# Patient Record
Sex: Male | Born: 1980 | Race: White | Hispanic: No | Marital: Single | Smoking: Never smoker
Health system: Southern US, Community
[De-identification: ages and names within clinical notes are randomized; demographics above are authoritative.]

---
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
## 2006-11-17 ENCOUNTER — Ambulatory Visit: Payer: Self-pay | Admitting: Family Medicine

## 2006-11-19 ENCOUNTER — Ambulatory Visit: Payer: Self-pay | Admitting: Family Medicine

## 2007-12-19 ENCOUNTER — Ambulatory Visit: Payer: Self-pay | Admitting: Family Medicine

## 2008-04-09 ENCOUNTER — Ambulatory Visit: Payer: Self-pay | Admitting: Family Medicine

## 2008-04-12 ENCOUNTER — Ambulatory Visit: Payer: Self-pay | Admitting: Family Medicine

## 2008-05-23 ENCOUNTER — Ambulatory Visit: Payer: Self-pay | Admitting: Family Medicine

## 2008-05-23 ENCOUNTER — Encounter: Admission: RE | Admit: 2008-05-23 | Discharge: 2008-05-23 | Payer: Self-pay | Admitting: Family Medicine

## 2010-02-25 ENCOUNTER — Ambulatory Visit: Payer: Self-pay | Admitting: Family Medicine

## 2010-04-30 ENCOUNTER — Ambulatory Visit: Payer: Self-pay | Admitting: Family Medicine

## 2010-10-20 ENCOUNTER — Encounter (HOSPITAL_COMMUNITY): Payer: Self-pay

## 2010-10-20 ENCOUNTER — Emergency Department (HOSPITAL_COMMUNITY): Payer: BC Managed Care – PPO

## 2010-10-20 ENCOUNTER — Emergency Department (HOSPITAL_COMMUNITY)
Admission: EM | Admit: 2010-10-20 | Discharge: 2010-10-20 | Disposition: A | Discharge status: Home / Self Care | Payer: BC Managed Care – PPO | Attending: Emergency Medicine | Admitting: Emergency Medicine

## 2010-10-20 DIAGNOSIS — Y9289 Other specified places as the place of occurrence of the external cause: Secondary | ICD-10-CM | POA: Insufficient documentation

## 2010-10-20 DIAGNOSIS — S9030XA Contusion of unspecified foot, initial encounter: Secondary | ICD-10-CM | POA: Insufficient documentation

## 2010-10-20 DIAGNOSIS — X500XXA Overexertion from strenuous movement or load, initial encounter: Secondary | ICD-10-CM | POA: Insufficient documentation

## 2011-08-28 ENCOUNTER — Telehealth: Payer: Self-pay | Admitting: Family Medicine

## 2011-08-28 NOTE — Telephone Encounter (Signed)
DONE

## 2015-05-30 ENCOUNTER — Encounter: Payer: Self-pay | Admitting: Podiatry

## 2015-05-30 ENCOUNTER — Ambulatory Visit (INDEPENDENT_AMBULATORY_CARE_PROVIDER_SITE_OTHER): Payer: BLUE CROSS/BLUE SHIELD | Admitting: Podiatry

## 2015-05-30 ENCOUNTER — Ambulatory Visit (INDEPENDENT_AMBULATORY_CARE_PROVIDER_SITE_OTHER): Payer: BLUE CROSS/BLUE SHIELD

## 2015-05-30 VITALS — BP 128/77 | HR 69 | Resp 16

## 2015-05-30 DIAGNOSIS — M779 Enthesopathy, unspecified: Secondary | ICD-10-CM

## 2015-05-30 DIAGNOSIS — M79671 Pain in right foot: Secondary | ICD-10-CM | POA: Diagnosis not present

## 2015-05-30 MED ORDER — MELOXICAM 15 MG PO TABS: 15.0000 mg | ORAL_TABLET | Freq: Every day | ORAL | Status: DC

## 2015-05-30 NOTE — Progress Notes (Signed)
   Subjective:    Patient ID: William Benton, male    DOB: [birth year], 34 y.o.   MRN: [id number]  HPI Comments: "I am having some pain in the bottom of this foot"  Patient presents with: Foot Pain: Plantar forefoot - 2nd/3rd MPJ right - aching and tingling for 4 days, swollen, also feeling a pulling sensation in lateral shin area right leg    Foot Pain Associated symptoms include arthralgias and myalgias.      Review of Systems  Musculoskeletal: Positive for myalgias, back pain and arthralgias.  All other systems reviewed and are negative.      Objective:   Physical Exam: 34 year old white male in no apparent distress presents today with a chief complaint of painful second metatarsal area with an aching and pulling sensation 4 days. He also complains of painful calluses bilateral heels. Vital signs are stable he is alert and oriented 3. No erythema edema cellulitis drainage or odor. Pulses are strongly palpable bilateral. Neurologic sensorium is intact per Semmes-Weinstein monofilament. Neurologic sensorium also demonstrates deep tendon reflexes are intact. Muscle strength +5 over 5 dorsiflexion and plantar flexors and inverters everters all intrinsic musculature is intact. Orthopedic evaluation of his strength all joints distal to the ankle had full range of motion without crepitation. Cutaneous evaluation of straight supple well-hydrated cutis no erythema edema cellulitis drainage or odor. He has pain on end range of motion of the second metatarsophalangeal joint of the right foot. Pain on direct palpation of that joint. Radiographic evaluation does demonstrate elongated plantarflexed second metatarsal of the right foot resulting in capsulitis.        Assessment & Plan:  Assessment: Capsulitis second metatarsophalangeal joint right foot with dry xerotic skin.  Plan: Injected around the joint today with Kenalog and local anesthesia placed him on a meloxicam. I also suggested an  emollient for his skin.  Thigh-high DPM

## 2020-04-15 ENCOUNTER — Ambulatory Visit (INDEPENDENT_AMBULATORY_CARE_PROVIDER_SITE_OTHER): Payer: BC Managed Care – PPO | Admitting: Podiatrist

## 2020-04-15 ENCOUNTER — Other Ambulatory Visit: Payer: Self-pay

## 2020-04-15 DIAGNOSIS — M216X9 Other acquired deformities of unspecified foot: Secondary | ICD-10-CM | POA: Diagnosis not present

## 2020-04-15 DIAGNOSIS — S90821A Blister (nonthermal), right foot, initial encounter: Secondary | ICD-10-CM | POA: Diagnosis not present

## 2020-04-15 DIAGNOSIS — S90822A Blister (nonthermal), left foot, initial encounter: Secondary | ICD-10-CM

## 2020-04-15 DIAGNOSIS — Q667 Congenital pes cavus, unspecified foot: Secondary | ICD-10-CM | POA: Diagnosis not present

## 2020-04-15 NOTE — Progress Notes (Signed)
   Chief Complaint  Patient presents with  . Skin Problem    Bilateral heels and plantar forefoot. x2+ weeks. Pt stated, "I do a lot of walking for [employer]. I noticed peeling skin w/o drainage. I thought they were blisters. I started having pain that was up to 8/10". No treatment.      HPI: Patient is 39 y.o. male who presents today for blisters on the bottoms of both feet for over 2 weeks.  He relates he works for [employer] and does a lot of walking.  He has to wear company issued brown socks and brown shoes.  He currently wears sketchers shoes.  There are no problems to display for this patient.   No current outpatient medications on file prior to visit.   No current facility-administered medications on file prior to visit.    No Known Allergies  Review of Systems No fevers, chills, nausea, muscle aches, no difficulty breathing, no calf pain, no chest pain or shortness of breath.   Physical Exam  GENERAL APPEARANCE: Alert, conversant. Appropriately groomed. No acute distress.   VASCULAR: Pedal pulses palpable DP and PT bilateral.  Capillary refill time is immediate to all digits,  Proximal to distal cooling it warm to warm.  Digital perfusion adequate.   NEUROLOGIC: sensation is intact to 5.07 monofilament at 5/5 sites bilateral.  Light touch is intact bilateral, vibratory sensation intact bilateral  MUSCULOSKELETAL: acceptable muscle strength, tone and stability bilateral.  High arch foot type is noted with pressure points of metatarsal heads and heels noted.   No pain, crepitus or limitation noted with foot and ankle range of motion bilateral.   DERMATOLOGIC: areas of dried blisters have formed on the plantar aspect of bilateral feet at the metatarsal head region and heels.  The blisters appear non infected but are painful when walking.  No sign of superficial skin fungal infection noted-- as these blisters appear to be from friction and shear from walking.      Assessment      ICD-10-CM   1. 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 Prominent metatarsal head, unspecified laterality  M21.6X9   3. Blister of plantar aspect of left foot, initial encounter  S90.822A   4. Blister of plantar aspect of right foot, initial encounter  [id number]      Plan  Discussed shoe gear changes including getting new balance shoes that will work with the companys dress code.  In general discussed new balance makes a better walking shoe and this should be helpful for him.  Discussed the positive benefits of orthotics- will check coverage and let him know  Discussed use of pumice stone at home to slowly work away the dead skin.

## 2020-04-18 ENCOUNTER — Encounter: Payer: Self-pay | Admitting: Podiatrist

## 2020-09-14 ENCOUNTER — Emergency Department (HOSPITAL_COMMUNITY): Payer: No Typology Code available for payment source

## 2020-09-14 ENCOUNTER — Emergency Department (HOSPITAL_COMMUNITY)
Admission: EM | Admit: 2020-09-14 | Discharge: 2020-09-14 | Disposition: A | Discharge status: Home / Self Care | Payer: No Typology Code available for payment source | Attending: Emergency Medicine | Admitting: Emergency Medicine

## 2020-09-14 ENCOUNTER — Other Ambulatory Visit: Payer: Self-pay

## 2020-09-14 ENCOUNTER — Encounter (HOSPITAL_COMMUNITY): Payer: Self-pay | Admitting: *Deleted

## 2020-09-14 DIAGNOSIS — W000XXA Fall on same level due to ice and snow, initial encounter: Secondary | ICD-10-CM | POA: Diagnosis not present

## 2020-09-14 DIAGNOSIS — S52021A Displaced fracture of olecranon process without intraarticular extension of right ulna, initial encounter for closed fracture: Secondary | ICD-10-CM

## 2020-09-14 DIAGNOSIS — S52024A Nondisplaced fracture of olecranon process without intraarticular extension of right ulna, initial encounter for closed fracture: Secondary | ICD-10-CM | POA: Diagnosis not present

## 2020-09-14 DIAGNOSIS — S59901A Unspecified injury of right elbow, initial encounter: Secondary | ICD-10-CM | POA: Diagnosis present

## 2020-09-14 MED ORDER — MORPHINE SULFATE (PF) 4 MG/ML IV SOLN
4.0000 mg | Freq: Once | INTRAVENOUS | Status: AC
Start: 2020-09-14 — End: 2020-09-14
  Administered 2020-09-14: 4 mg via INTRAVENOUS
  Filled 2020-09-14: qty 1

## 2020-09-14 MED ORDER — HYDROCODONE-ACETAMINOPHEN 5-325 MG PO TABS: 1.0000 | ORAL_TABLET | ORAL | 0 refills | Status: AC | PRN

## 2020-09-14 MED ORDER — ONDANSETRON HCL 4 MG/2ML IJ SOLN
4.0000 mg | Freq: Once | INTRAMUSCULAR | Status: AC
  Administered 2020-09-14: 4 mg via INTRAVENOUS
  Filled 2020-09-14: qty 2

## 2020-09-14 MED ORDER — HYDROCODONE-ACETAMINOPHEN 5-325 MG PO TABS
1.0000 | ORAL_TABLET | Freq: Once | ORAL | Status: AC
Start: 2020-09-14 — End: 2020-09-14
  Administered 2020-09-14: 1 via ORAL
  Filled 2020-09-14: qty 1

## 2020-09-14 NOTE — ED Provider Notes (Signed)
[facility] EMERGENCY DEPARTMENT Provider Note   CSN: [id number] Arrival date & time: 09/14/20  0022     History Chief Complaint  Patient presents with  . Fall    William Benton is a 40 y.o. male.  Patient presents with complaint of severe right elbow pain after a fall.  He reports that he slipped on the snow and fell directly onto his right elbow.  Patient reports increased pain with moving the elbow.  No other injury.  Did not hit his head or lose consciousness.        History reviewed. No pertinent past medical history.  There are no problems to display for this patient.   History reviewed. No pertinent surgical history.     No family history on file.  Social History   Tobacco Use  . Smoking status: Never Smoker  Substance Use Topics  . Alcohol use: No    Alcohol/week: 0.0 standard drinks    Home Medications Prior to Admission medications   Not on File    Allergies    Patient has no known allergies.  Review of Systems   Review of Systems  Musculoskeletal: Positive for arthralgias.  All other systems reviewed and are negative.   Physical Exam Updated Vital Signs BP 119/73 (BP Location: Left Arm)   Pulse 68   Temp 98.2 F (36.8 C) (Oral)   Resp 18   Ht 5\' 10"  (1.778 m)   Wt 81.6 kg   SpO2 100%   BMI 25.83 kg/m   Physical Exam Vitals and nursing note reviewed.  Constitutional:      General: He is not in acute distress.    Appearance: Normal appearance. He is well-developed and well-nourished.  HENT:     Head: Normocephalic and atraumatic.     Right Ear: Hearing normal.     Left Ear: Hearing normal.     Nose: Nose normal.     Mouth/Throat:     Mouth: Oropharynx is clear and moist and mucous membranes are normal.  Eyes:     Extraocular Movements: EOM normal.     Conjunctiva/sclera: Conjunctivae normal.     Pupils: Pupils are equal, round, and reactive to light.  Cardiovascular:     Rate and Rhythm: Regular rhythm.      Heart sounds: S1 normal and S2 normal. No murmur heard. No friction rub. No gallop.   Pulmonary:     Effort: Pulmonary effort is normal. No respiratory distress.     Breath sounds: Normal breath sounds.  Chest:     Chest wall: No tenderness.  Abdominal:     General: Bowel sounds are normal.     Palpations: Abdomen is soft. There is no hepatosplenomegaly.     Tenderness: There is no abdominal tenderness. There is no guarding or rebound. Negative signs include Murphy's sign and McBurney's sign.     Hernia: No hernia is present.  Musculoskeletal:     Right elbow: Swelling present. No deformity. Decreased range of motion. Tenderness present.     Cervical back: Normal range of motion and neck supple.  Skin:    General: Skin is warm, dry and intact.     Findings: No rash.     Nails: There is no cyanosis.  Neurological:     Mental Status: He is alert and oriented to person, place, and time.     GCS: GCS eye subscore is 4. GCS verbal subscore is 5. GCS motor subscore is 6.  Cranial Nerves: No cranial nerve deficit.     Sensory: No sensory deficit.     Coordination: Coordination normal.     Deep Tendon Reflexes: Strength normal.  Psychiatric:        Mood and Affect: Mood and affect normal.        Speech: Speech normal.        Behavior: Behavior normal.        Thought Content: Thought content normal.     ED Results / Procedures / Treatments   Labs (all labs ordered are listed, but only abnormal results are displayed) Labs Reviewed - No data to display  EKG None  Radiology No results found.  Procedures Procedures (including critical care time)  Medications Ordered in ED Medications  morphine 4 MG/ML injection 4 mg (has no administration in time range)  ondansetron (ZOFRAN) injection 4 mg (has no administration in time range)    ED Course  I have reviewed the triage vital signs and the nursing notes.  Pertinent labs & imaging results that were available during my  care of the patient were reviewed by me and considered in my medical decision making (see chart for details).    MDM Rules/Calculators/A&P                          Patient presents with isolated right elbow injury after a fall.  X-ray shows nondisplaced olecranon process fracture.  Patient provided analgesia, long-arm splint, sling and follow-up with orthopedics. Final Clinical Impression(s) / ED Diagnoses Final diagnoses:  None    Rx / DC Orders ED Discharge Orders    None       Rodd Heft, Canary Brim, MD 09/15/20 [phone]

## 2020-09-14 NOTE — ED Notes (Signed)
Pt given additional pain meds prior to d/c. Patient educated about not driving or performing other critical tasks (such as operating heavy machinery, caring for infant/toddler/child) due to sedative nature of narcotic medications received while in the ED.  Pt/caregiver verbalized understanding.  Pt verbalized understanding of d/c instructions, f/u with ortho and pain medication rx given.

## 2020-09-14 NOTE — Progress Notes (Signed)
Orthopedic Tech Progress Note Patient Details:  William Benton 05-Jan-1981 [id number]  Ortho Devices Type of Ortho Device: Arm sling,Long arm splint Ortho Device/Splint Location: Right Arm Ortho Device/Splint Interventions: Application   Post Interventions Patient Tolerated: Well Instructions Provided: Care of device,Adjustment of device   Ladean Steinmeyer E Alisha Bacus 09/14/2020, 1:56 AM

## 2020-09-14 NOTE — ED Notes (Signed)
Ortho tech at the bedside.  

## 2020-09-14 NOTE — ED Triage Notes (Signed)
Pt says that he slipped on the snow walking to his car, he c/o pain in the right elbow area, no other c/o pain. Pain still 9/10

## 2020-09-14 NOTE — ED Triage Notes (Signed)
Pt arrives via [facility], leaving work, slipped on ice fell on the right side, he c/o pain in the right elbow, no obvious deformity noted.  10/10 pain in extremity. . IV access in the left ac. No LOC. Fentanyl x 2 doses. 117/60, hr 80's.

## 2022-04-17 IMAGING — CR DG ELBOW COMPLETE 3+V*R*
4 series · 4 of 4 positions shown · non-contrast
Comparison: None.

CLINICAL DATA: Fell on ice.  Right elbow deformity.

EXAM:
RIGHT ELBOW - COMPLETE 3+ VIEW

[elbow lat]
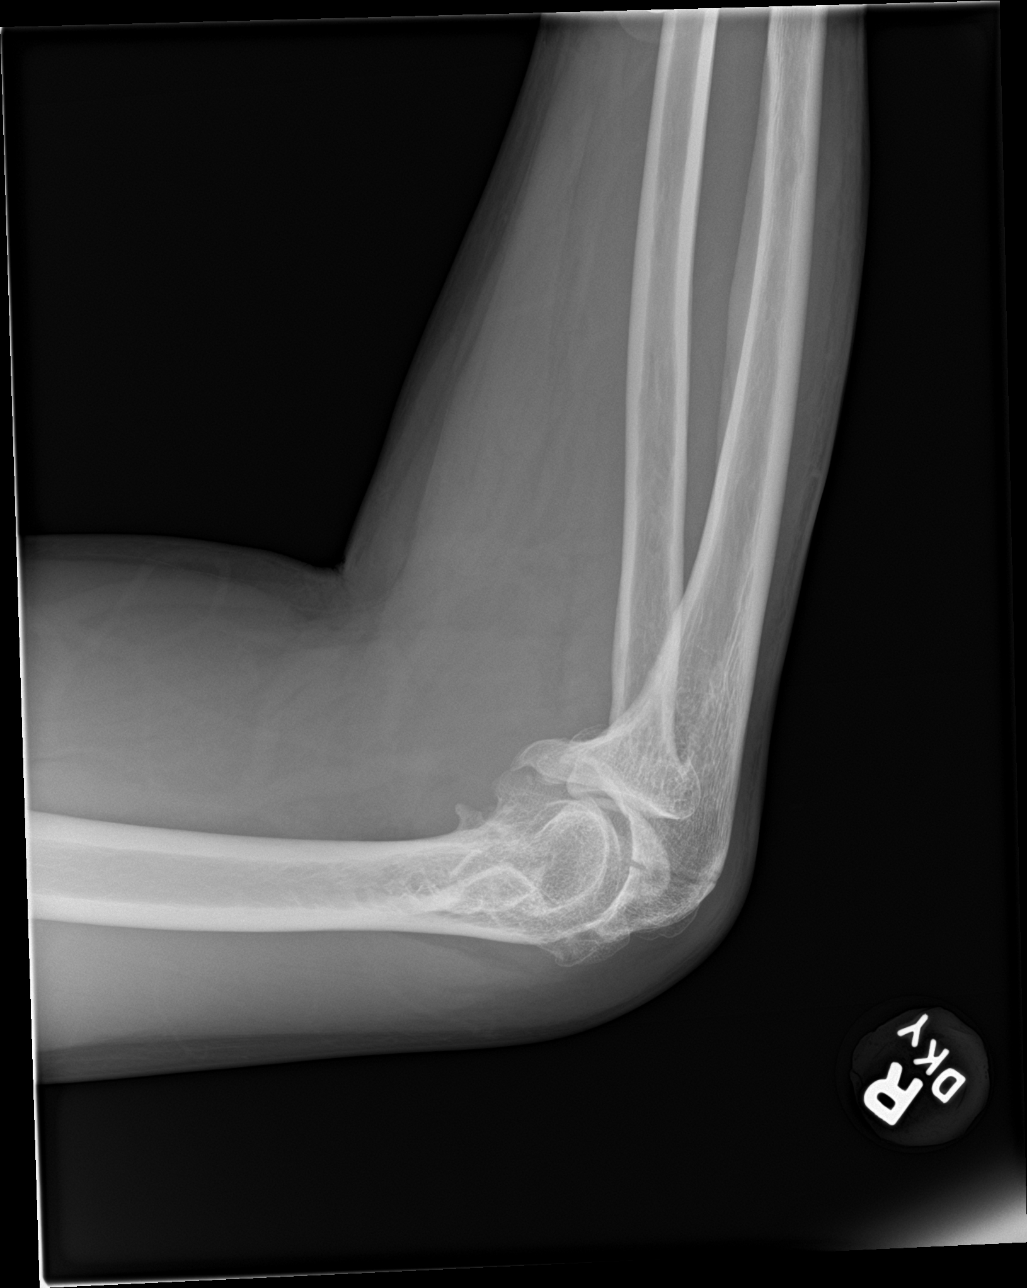

[elbow obl (1 of 2)]
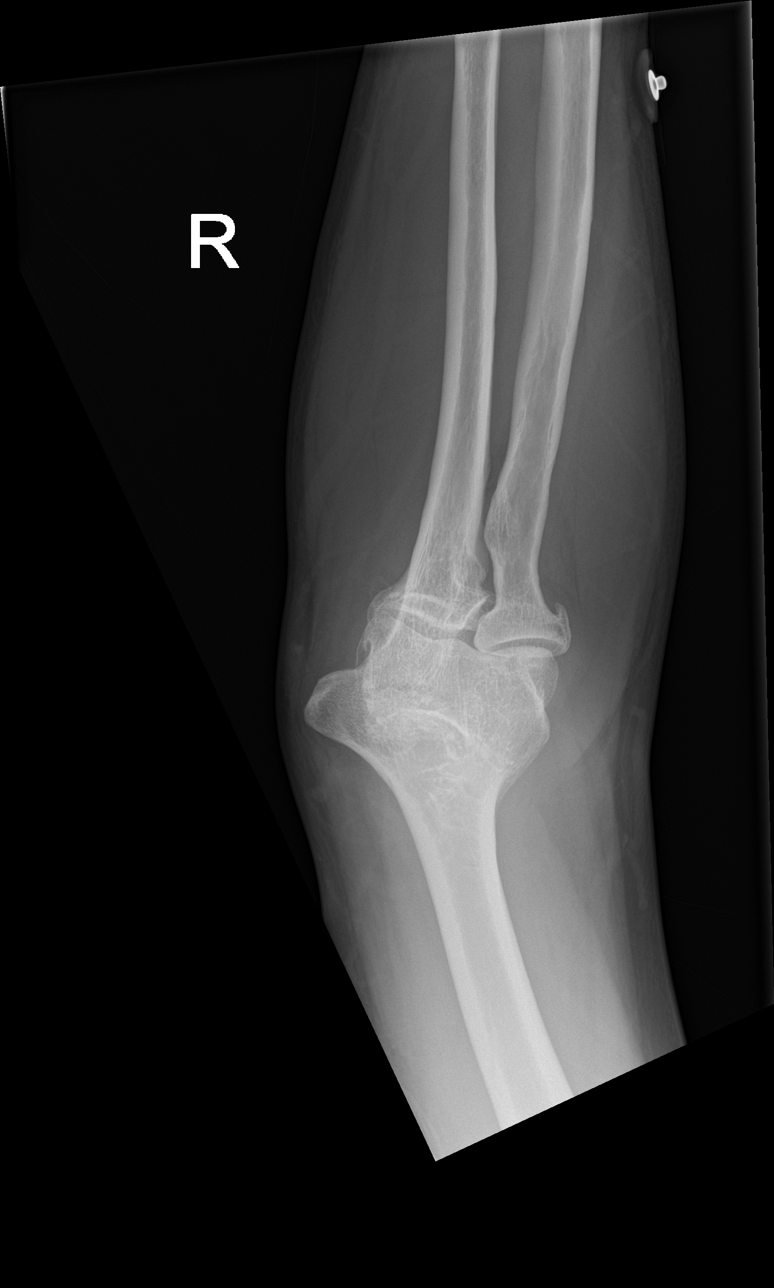

[elbow obl (2 of 2)]
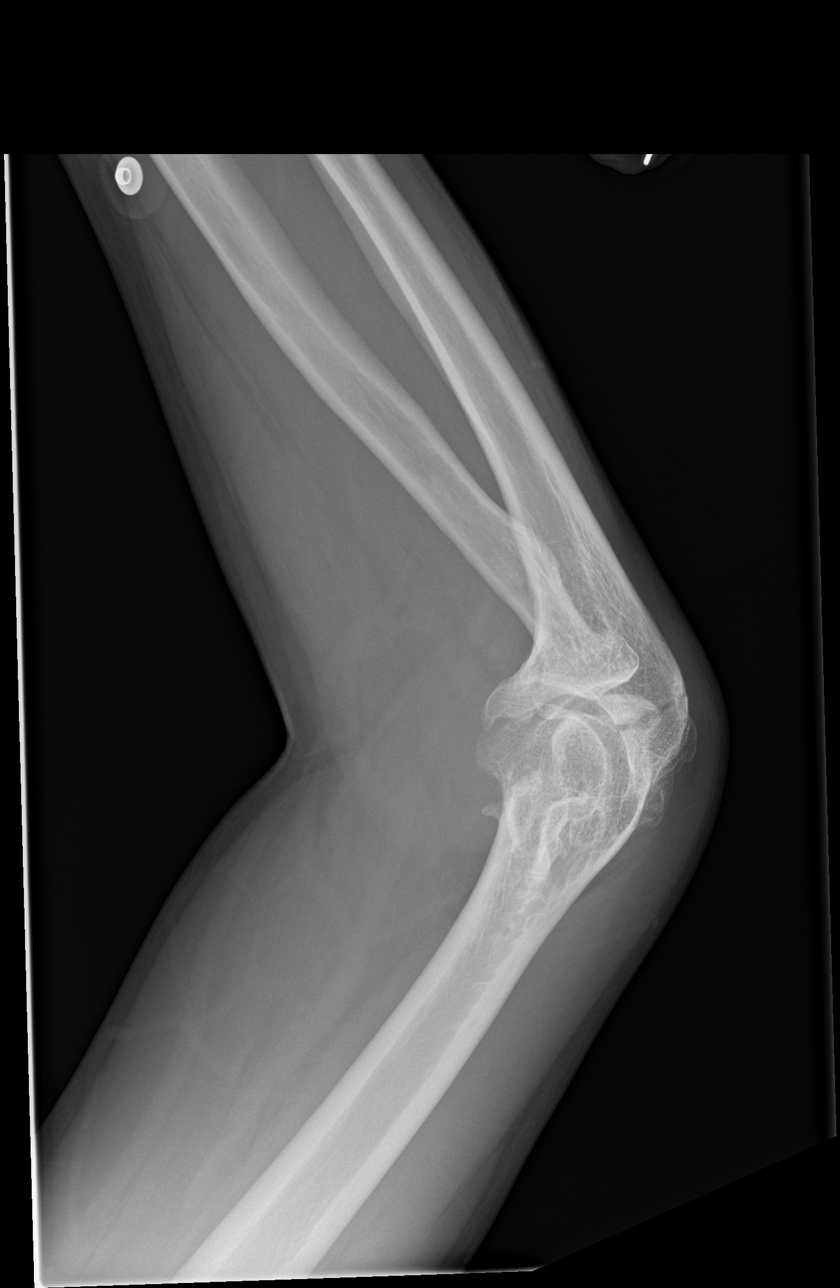

[elbow ap]
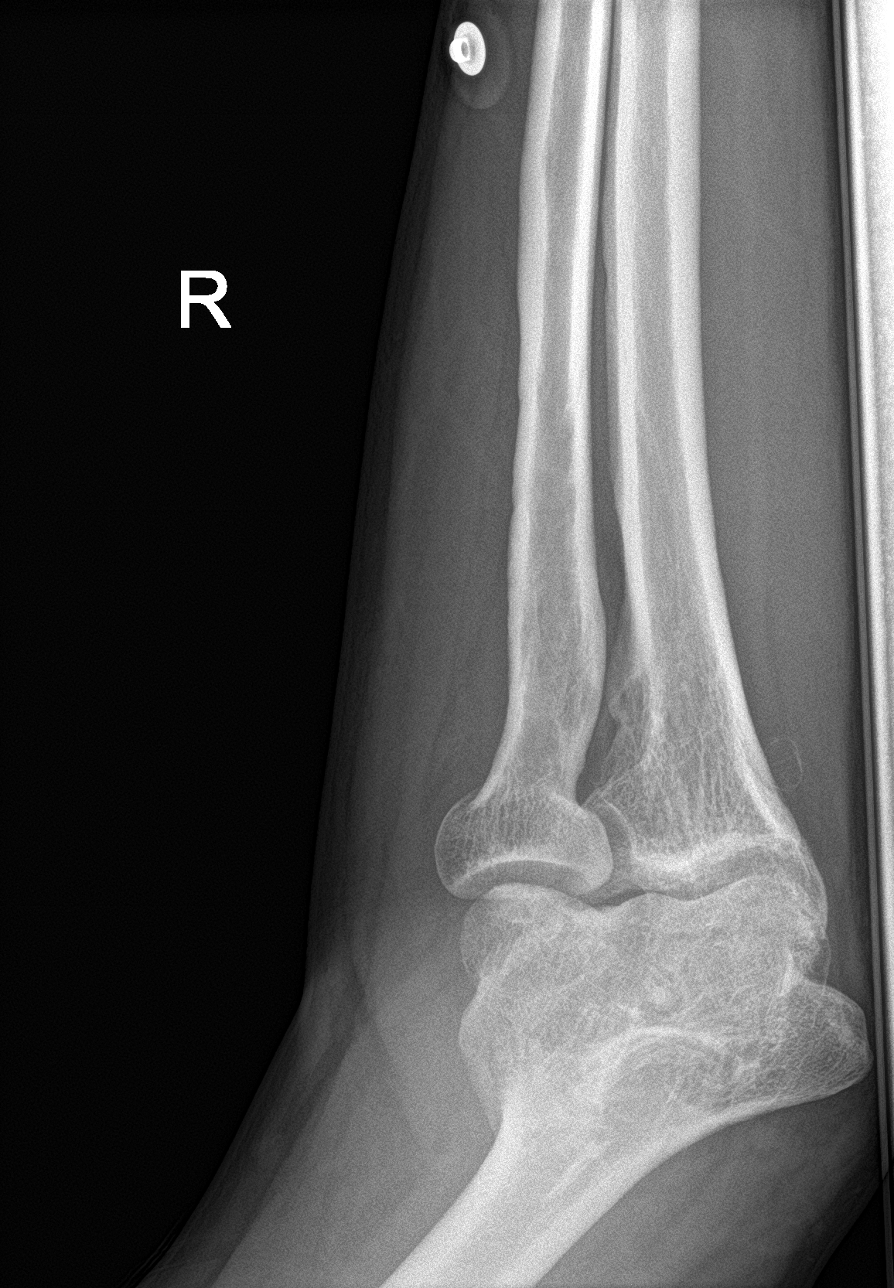

[4 of 4 positions shown; findings below may reference images not displayed]

FINDINGS: Degenerative changes in the right elbow. Prominent osteophyte
formation. Linear nondisplaced fracture of the posterior olecranon
extending to the articular surface. Associated elbow effusion. No
additional fractures are identified. No dislocation.
IMPRESSION: Nondisplaced fracture of the posterior olecranon with associated
effusion.

## 2022-11-29 ENCOUNTER — Emergency Department (HOSPITAL_COMMUNITY)
Admission: EM | Admit: 2022-11-29 | Discharge: 2022-11-29 | Disposition: A | Discharge status: Home / Self Care | Payer: BC Managed Care – PPO | Attending: Emergency Medicine | Admitting: Emergency Medicine

## 2022-11-29 ENCOUNTER — Other Ambulatory Visit: Payer: Self-pay

## 2022-11-29 ENCOUNTER — Emergency Department (HOSPITAL_COMMUNITY): Payer: BC Managed Care – PPO

## 2022-11-29 DIAGNOSIS — R109 Unspecified abdominal pain: Secondary | ICD-10-CM | POA: Diagnosis present

## 2022-11-29 DIAGNOSIS — I880 Nonspecific mesenteric lymphadenitis: Secondary | ICD-10-CM | POA: Insufficient documentation

## 2022-11-29 LAB — BASIC METABOLIC PANEL
Anion gap: 6 (ref 5–15)
BUN: 14 mg/dL (ref 6–20)
CO2: 22 mmol/L (ref 22–32)
Calcium: 8.9 mg/dL (ref 8.9–10.3)
Chloride: 111 mmol/L (ref 98–111)
Creatinine, Ser: 0.76 mg/dL (ref 0.61–1.24)
GFR, Estimated: >60 mL/min (ref >60)
Glucose, Bld: 96 mg/dL (ref 70–99)
Potassium: 3.7 mmol/L (ref 3.5–5.1)
Sodium: 139 mmol/L (ref 135–145)

## 2022-11-29 LAB — CBC WITH DIFFERENTIAL/PLATELET
Abs Immature Granulocytes: 0.04 10*3/uL (ref 0.00–0.07)
Basophils Absolute: 0.1 10*3/uL (ref 0.0–0.1)
Basophils Relative: 1 %
Eosinophils Absolute: 0.2 10*3/uL (ref 0.0–0.5)
Eosinophils Relative: 2 %
HCT: 48.6 % (ref 39.0–52.0)
Hemoglobin: 16.6 g/dL (ref 13.0–17.0)
Immature Granulocytes: 0 %
Lymphocytes Relative: 26 %
Lymphs Abs: 2.5 10*3/uL (ref 0.7–4.0)
MCH: 30.9 pg (ref 26.0–34.0)
MCHC: 34.2 g/dL (ref 30.0–36.0)
MCV: 90.3 fL (ref 80.0–100.0)
Monocytes Absolute: 0.7 10*3/uL (ref 0.1–1.0)
Monocytes Relative: 7 %
Neutro Abs: 6.1 10*3/uL (ref 1.7–7.7)
Neutrophils Relative %: 64 %
Platelets: 295 10*3/uL (ref 150–400)
RBC: 5.38 MIL/uL (ref 4.22–5.81)
RDW: 12.2 % (ref 11.5–15.5)
WBC: 9.6 10*3/uL (ref 4.0–10.5)
nRBC: 0 % (ref 0.0–0.2)

## 2022-11-29 LAB — URINALYSIS, ROUTINE W REFLEX MICROSCOPIC
Bilirubin Urine: NEGATIVE
Glucose, UA: NEGATIVE mg/dL
Hgb urine dipstick: NEGATIVE
Ketones, ur: NEGATIVE mg/dL
Leukocytes,Ua: NEGATIVE
Nitrite: NEGATIVE
Protein, ur: NEGATIVE mg/dL
Specific Gravity, Urine: 1.013 (ref 1.005–1.030)
pH: 8 (ref 5.0–8.0)

## 2022-11-29 MED ORDER — ONDANSETRON HCL 4 MG/2ML IJ SOLN
4.0000 mg | Freq: Once | INTRAMUSCULAR | Status: AC
  Administered 2022-11-29: 4 mg via INTRAVENOUS
  Filled 2022-11-29: qty 2

## 2022-11-29 MED ORDER — HYDROMORPHONE HCL 1 MG/ML IJ SOLN
0.5000 mg | Freq: Once | INTRAMUSCULAR | Status: AC
  Administered 2022-11-29: 0.5 mg via INTRAVENOUS
  Filled 2022-11-29: qty 1

## 2022-11-29 MED ORDER — OXYCODONE-ACETAMINOPHEN 5-325 MG PO TABS: 1.0000 | ORAL_TABLET | Freq: Three times a day (TID) | ORAL | 0 refills | Status: AC | PRN

## 2022-11-29 MED ORDER — METHOCARBAMOL 500 MG PO TABS: 500.0000 mg | ORAL_TABLET | Freq: Three times a day (TID) | ORAL | 0 refills | Status: AC | PRN

## 2022-11-29 MED ORDER — SODIUM CHLORIDE 0.9 % IV SOLN: 100.0000 mg | Freq: Once | INTRAVENOUS | Status: DC

## 2022-11-29 MED ORDER — KETOROLAC TROMETHAMINE 15 MG/ML IJ SOLN
15.0000 mg | Freq: Once | INTRAMUSCULAR | Status: AC
  Administered 2022-11-29: 15 mg via INTRAVENOUS
  Filled 2022-11-29: qty 1

## 2022-11-29 NOTE — ED Triage Notes (Signed)
Pt arrived via POV. Pt c/o L flank pain beginning this AM. Visibly uncomfortable.  AOx4

## 2022-11-29 NOTE — ED Provider Notes (Signed)
EMERGENCY DEPARTMENT AT [facility] Provider Note   CSN: [id number] Arrival date & time: 11/29/22  1220     History  Chief Complaint  Patient presents with   Flank Pain    William Benton is a 42 y.o. male.   Flank Pain  Patient presents with left flank pain.  Woke with it this morning.  Uncomfortable.  Feels the need to urinate.  Cannot find a comfortable position.  No fevers.  Has had some nausea.  No diarrhea or constipation.  Has not had pains like this before.  No history of kidney stones.    No past medical history on file.  Home Medications Prior to Admission medications   Medication Sig Start Date End Date Taking? Authorizing Provider  methocarbamol (ROBAXIN) 500 MG tablet Take 1 tablet (500 mg total) by mouth every 8 (eight) hours as needed for muscle spasms. 11/29/22  Yes Benjiman Core, MD  oxyCODONE-acetaminophen (PERCOCET/ROXICET) 5-325 MG tablet Take 1-2 tablets by mouth every 8 (eight) hours as needed for severe pain. 11/29/22  Yes Benjiman Core, MD  HYDROcodone-acetaminophen (NORCO/VICODIN) 5-325 MG tablet Take 1 tablet by mouth every 4 (four) hours as needed for moderate pain. 09/14/20   Gilda Crease, MD      Allergies    Patient has no known allergies.    Review of Systems   Review of Systems  Genitourinary:  Positive for flank pain.    Physical Exam Updated Vital Signs BP (!) 122/59   Pulse (!) 57   Temp 98 F (36.7 C) (Oral)   Resp 17   Ht 5\' 10"  (1.778 m)   Wt 93.4 kg   SpO2 96%   BMI 29.56 kg/m  Physical Exam Vitals and nursing note reviewed.  Constitutional:      Comments: Appears uncomfortable.  Eyes:     Pupils: Pupils are equal, round, and reactive to light.  Cardiovascular:     Rate and Rhythm: Regular rhythm.  Pulmonary:     Breath sounds: No wheezing or rhonchi.  Abdominal:     Tenderness: There is no abdominal tenderness.  Genitourinary:    Comments: Some CVA tenderness on left. Skin:     General: Skin is warm.     Capillary Refill: Capillary refill takes less than 2 seconds.  Neurological:     Mental Status: He is alert and oriented to person, place, and time.     ED Results / Procedures / Treatments   Labs (all labs ordered are listed, but only abnormal results are displayed) Labs Reviewed  URINALYSIS, ROUTINE W REFLEX MICROSCOPIC - Abnormal; Notable for the following components:      Result Value   APPearance CLOUDY (*)    All other components within normal limits  CBC WITH DIFFERENTIAL/PLATELET  BASIC METABOLIC PANEL    EKG None  Radiology CT Renal Stone Study  Result Date: 11/29/2022 CLINICAL DATA:  Abdominal/flank pain, stone suspected EXAM: CT ABDOMEN AND PELVIS WITHOUT CONTRAST TECHNIQUE: Multidetector CT imaging of the abdomen and pelvis was performed following the standard protocol without IV contrast. RADIATION DOSE REDUCTION: This exam was performed according to the departmental dose-optimization program which includes automated exposure control, adjustment of the mA and/or kV according to patient size and/or use of iterative reconstruction technique. COMPARISON:  None Available. FINDINGS: Evaluation is limited by lack of IV contrast. Lower chest: No acute abnormality. Hepatobiliary: Unremarkable noncontrast appearance of the liver and gallbladder. Pancreas: No peripancreatic fat stranding. Spleen: Unremarkable. Adrenals/Urinary Tract: Adrenal  glands are unremarkable. No hydronephrosis. There are several punctate nonobstructing LEFT-sided nephrolithiasis. No obstructing nephrolithiasis are visualized. Bladder is unremarkable. Stomach/Bowel: No evidence of bowel obstruction. Moderate colonic stool burden throughout the colon. Appendix is normal. Stomach is decompressed. Vascular/Lymphatic: Abdominal aorta is normal in course and caliber. No suspicious retroperitoneal adenopathy. There are several prominent mesenteric lymph nodes in the LEFT upper quadrant with  representative elongated conglomeration of mesenteric lymph nodes measuring approximately 7 mm in the short axis (series 2, image 26; series 5, image 54). Reproductive: Prostate is unremarkable. Other: No free air or free fluid. Musculoskeletal: No acute or significant osseous findings. IMPRESSION: 1. There are several punctate nonobstructing LEFT-sided nephrolithiasis. No obstructing nephrolithiasis or hydronephrosis. 2. There are several prominent mesenteric lymph nodes in the LEFT upper quadrant. These are nonspecific and could reflect the sequela of prior infection or mesenteric adenitis. 3. Moderate colonic stool burden throughout the colon. Electronically Signed   By: Meda Klinefelter M.D.   On: 11/29/2022 15:49    Procedures Procedures    Medications Ordered in ED Medications  HYDROmorphone (DILAUDID) injection 0.5 mg (0.5 mg Intravenous Given 11/29/22 1357)  ondansetron (ZOFRAN) injection 4 mg (4 mg Intravenous Given 11/29/22 1356)  ketorolac (TORADOL) 15 MG/ML injection 15 mg (15 mg Intravenous Given 11/29/22 1611)    ED Course/ Medical Decision Making/ A&P                             Medical Decision Making Amount and/or Complexity of Data Reviewed Labs: ordered. Radiology: ordered.  Risk Prescription drug management.  Patient with left flank pain.  Began this morning.  Severe.  Cannot find a comfortable position.  Differential diagnosis infection, obstruction but also ureteral stone.  Will give Dilaudid to help with pain medicine.  Potentially Toradol candidate but would like to know more since potential of stone.  Will get basic blood work including CBC and basic metabolic panel.  Doubt other causes such as pancreatitis since he does not have abdominal tenderness.  CT scan reassuring for obstructing ureteral stone.  Does have nonobstructing renal stones on the right.  Does have some mesenteric adenitis/swollen lymph nodes potential could be a cause of pain.  Will treat symptomatic  and follow-up as an outpatient.  Feels better.  Discharge home.        Final Clinical Impression(s) / ED Diagnoses Final diagnoses:  Left flank pain  Mesenteric adenitis    Rx / DC Orders ED Discharge Orders          Ordered    methocarbamol (ROBAXIN) 500 MG tablet  Every 8 hours PRN        11/29/22 1631    oxyCODONE-acetaminophen (PERCOCET/ROXICET) 5-325 MG tablet  Every 8 hours PRN        11/29/22 1631              Benjiman Core, MD 11/29/22 2127
